# Patient Record
Sex: Male | Born: 1990 | Race: Black or African American | Hispanic: No | Marital: Single
Health system: Midwestern US, Community
[De-identification: ages and names within clinical notes are randomized; demographics above are authoritative.]

---

## 2017-09-05 ENCOUNTER — Emergency Department (HOSPITAL_BASED_OUTPATIENT_CLINIC_OR_DEPARTMENT_OTHER)
Admission: EM | Admit: 2017-09-05 | Discharge: 2017-09-06 | Disposition: A | Discharge status: Home / Self Care | Payer: No Typology Code available for payment source | Attending: Emergency Medicine | Admitting: Emergency Medicine

## 2017-09-05 ENCOUNTER — Encounter (HOSPITAL_BASED_OUTPATIENT_CLINIC_OR_DEPARTMENT_OTHER): Payer: Self-pay | Admitting: Adult Health

## 2017-09-05 ENCOUNTER — Other Ambulatory Visit: Payer: Self-pay

## 2017-09-05 DIAGNOSIS — Y999 Unspecified external cause status: Secondary | ICD-10-CM | POA: Diagnosis not present

## 2017-09-05 DIAGNOSIS — S40021A Contusion of right upper arm, initial encounter: Secondary | ICD-10-CM | POA: Diagnosis not present

## 2017-09-05 DIAGNOSIS — Y9389 Activity, other specified: Secondary | ICD-10-CM | POA: Diagnosis not present

## 2017-09-05 DIAGNOSIS — S0081XA Abrasion of other part of head, initial encounter: Secondary | ICD-10-CM | POA: Diagnosis not present

## 2017-09-05 DIAGNOSIS — S50811A Abrasion of right forearm, initial encounter: Secondary | ICD-10-CM | POA: Insufficient documentation

## 2017-09-05 DIAGNOSIS — Y92410 Unspecified street and highway as the place of occurrence of the external cause: Secondary | ICD-10-CM | POA: Insufficient documentation

## 2017-09-05 DIAGNOSIS — F17200 Nicotine dependence, unspecified, uncomplicated: Secondary | ICD-10-CM | POA: Diagnosis not present

## 2017-09-05 DIAGNOSIS — S40211A Abrasion of right shoulder, initial encounter: Secondary | ICD-10-CM | POA: Diagnosis not present

## 2017-09-05 DIAGNOSIS — S0993XA Unspecified injury of face, initial encounter: Secondary | ICD-10-CM | POA: Diagnosis present

## 2017-09-05 DIAGNOSIS — S161XXA Strain of muscle, fascia and tendon at neck level, initial encounter: Secondary | ICD-10-CM | POA: Insufficient documentation
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
## 2017-09-06 ENCOUNTER — Emergency Department (HOSPITAL_BASED_OUTPATIENT_CLINIC_OR_DEPARTMENT_OTHER): Payer: No Typology Code available for payment source

## 2017-09-06 MED ORDER — HYDROCODONE-ACETAMINOPHEN 5-325 MG PO TABS
2.0000 | ORAL_TABLET | Freq: Once | ORAL | Status: AC
  Administered 2017-09-06: 2 via ORAL
  Filled 2017-09-06: qty 2

## 2017-09-06 MED ORDER — HYDROCODONE-ACETAMINOPHEN 5-325 MG PO TABS: 1.0000 | ORAL_TABLET | Freq: Four times a day (QID) | ORAL | 0 refills | Status: DC | PRN

## 2017-09-06 MED ORDER — LIDOCAINE HCL URETHRAL/MUCOSAL 2 % EX GEL
1.0000 "application " | Freq: Once | CUTANEOUS | Status: AC
  Administered 2017-09-06: 1 via TOPICAL
  Filled 2017-09-06: qty 20

## 2017-09-06 NOTE — Discharge Instructions (Addendum)
Local wound care with bacitracin and dressing changes twice daily.  Hydrocodone as prescribed as needed for pain.  Follow-up as needed if symptoms worsen or change.
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
## 2022-05-09 ENCOUNTER — Emergency Department (HOSPITAL_COMMUNITY): Payer: Medicaid Other

## 2022-05-09 ENCOUNTER — Ambulatory Visit: Payer: Self-pay | Admitting: Otolaryngology

## 2022-05-09 ENCOUNTER — Encounter (HOSPITAL_COMMUNITY): Payer: Self-pay

## 2022-05-09 ENCOUNTER — Other Ambulatory Visit: Payer: Self-pay

## 2022-05-09 ENCOUNTER — Emergency Department (HOSPITAL_COMMUNITY)
Admission: EM | Admit: 2022-05-09 | Discharge: 2022-05-09 | Disposition: A | Discharge status: Home / Self Care | Payer: Medicaid Other | Attending: Emergency Medicine | Admitting: Emergency Medicine

## 2022-05-09 DIAGNOSIS — S02652A Fracture of angle of left mandible, initial encounter for closed fracture: Secondary | ICD-10-CM

## 2022-05-09 DIAGNOSIS — Z1152 Encounter for screening for COVID-19: Secondary | ICD-10-CM | POA: Insufficient documentation

## 2022-05-09 DIAGNOSIS — S0993XA Unspecified injury of face, initial encounter: Secondary | ICD-10-CM | POA: Diagnosis present

## 2022-05-09 LAB — RESP PANEL BY RT-PCR (RSV, FLU A&B, COVID)  RVPGX2
Influenza A by PCR: NEGATIVE
Influenza B by PCR: NEGATIVE
Resp Syncytial Virus by PCR: NEGATIVE
SARS Coronavirus 2 by RT PCR: NEGATIVE

## 2022-05-09 LAB — GROUP A STREP BY PCR: Group A Strep by PCR: NOT DETECTED

## 2022-05-09 MED ORDER — HYDROCODONE-ACETAMINOPHEN 7.5-325 MG/15ML PO SOLN
10.0000 mL | Freq: Once | ORAL | Status: AC
  Administered 2022-05-09: 10 mL via ORAL
  Filled 2022-05-09: qty 15

## 2022-05-09 MED ORDER — HYDROCODONE-ACETAMINOPHEN 7.5-325 MG/15ML PO SOLN: 15.0000 mL | Freq: Four times a day (QID) | ORAL | 0 refills | Status: DC | PRN

## 2022-05-09 MED ORDER — HYDROMORPHONE HCL 1 MG/ML IJ SOLN
0.5000 mg | Freq: Once | INTRAMUSCULAR | Status: AC
  Administered 2022-05-09: 0.5 mg via INTRAVENOUS
  Filled 2022-05-09: qty 1

## 2022-05-09 MED ORDER — HYDROCODONE-ACETAMINOPHEN 7.5-325 MG/15ML PO SOLN: 15.0000 mL | Freq: Four times a day (QID) | ORAL | 0 refills | Status: AC | PRN

## 2022-05-09 MED ORDER — KETOROLAC TROMETHAMINE 15 MG/ML IJ SOLN
15.0000 mg | Freq: Once | INTRAMUSCULAR | Status: AC
  Administered 2022-05-09: 15 mg via INTRAVENOUS
  Filled 2022-05-09: qty 1

## 2022-05-09 MED ORDER — ONDANSETRON HCL 4 MG/2ML IJ SOLN
4.0000 mg | Freq: Once | INTRAMUSCULAR | Status: AC
  Administered 2022-05-09: 4 mg via INTRAVENOUS
  Filled 2022-05-09: qty 2
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
## 2022-05-09 NOTE — Discharge Instructions (Addendum)
Go to short stay at [facility] at noon tomorrow.  Go to the main entrance.  Do not eat after midnight.

## 2022-05-10 ENCOUNTER — Encounter (HOSPITAL_COMMUNITY): Payer: Self-pay | Admitting: Otolaryngology

## 2022-05-10 ENCOUNTER — Inpatient Hospital Stay: Admit: 2022-05-10 | Payer: No Typology Code available for payment source | Admitting: Otolaryngology

## 2022-05-10 ENCOUNTER — Encounter (HOSPITAL_COMMUNITY): Payer: Self-pay | Admitting: Certified Registered Nurse Anesthetist

## 2022-05-10 SURGERY — OPEN REDUCTION INTERNAL FIXATION (ORIF) MANDIBULAR FRACTURE
Anesthesia: General

## 2022-05-12 ENCOUNTER — Other Ambulatory Visit: Payer: Self-pay

## 2022-05-12 ENCOUNTER — Emergency Department (HOSPITAL_COMMUNITY)
Admission: EM | Admit: 2022-05-12 | Discharge: 2022-05-12 | Discharge status: Against Medical Advice | Payer: Medicaid Other | Attending: Emergency Medicine | Admitting: Emergency Medicine

## 2022-05-12 DIAGNOSIS — R509 Fever, unspecified: Secondary | ICD-10-CM | POA: Diagnosis not present

## 2022-05-12 DIAGNOSIS — Z5329 Procedure and treatment not carried out because of patient's decision for other reasons: Secondary | ICD-10-CM | POA: Insufficient documentation

## 2022-05-12 DIAGNOSIS — S02652G Fracture of angle of left mandible, subsequent encounter for fracture with delayed healing: Secondary | ICD-10-CM

## 2022-05-12 DIAGNOSIS — S0993XA Unspecified injury of face, initial encounter: Secondary | ICD-10-CM | POA: Diagnosis present

## 2022-05-12 MED ORDER — MORPHINE SULFATE (PF) 4 MG/ML IV SOLN
4.0000 mg | Freq: Once | INTRAVENOUS | Status: AC
  Administered 2022-05-12: 4 mg via INTRAMUSCULAR
  Filled 2022-05-12: qty 1
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
## 2022-05-12 NOTE — ED Notes (Signed)
MD aware of temp 101. Per MD, pt is refusing treatment for fever at this time.

## 2022-05-12 NOTE — ED Notes (Signed)
Dr. Francia Greaves at bedside assessing pt at this time.
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
## 2022-05-12 NOTE — ED Notes (Signed)
Pt not in room. Staff did not see patient exit. Assume pt left. MD notified.

## 2022-05-12 NOTE — ED Notes (Signed)
Per MD, pt may have food and drink. Pt provided with 2 cups of water per request.
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
## 2022-05-12 NOTE — ED Notes (Signed)
Formatting of this note might be different from the original.  MD aware of temp 101. Per MD, pt is refusing treatment for fever at this time.  Electronically signed by Nickerson, [place] P, RN at 05/12/2022 10:01 AM EST

## 2022-05-12 NOTE — ED Notes (Signed)
Formatting of this note might be different from the original.  Per MD, pt may have food and drink. Pt provided with 2 cups of water per request.  Electronically signed by Nickerson, [place] P, RN at 05/12/2022  9:56 AM EST

## 2022-05-12 NOTE — ED Notes (Signed)
Formatting of this note might be different from the original.  Dr. Francia Greaves at bedside assessing pt at this time.   Electronically signed by Nickerson, [place] P, RN at 05/12/2022  8:52 AM EST
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
## 2022-05-14 ENCOUNTER — Encounter

## 2022-05-14 ENCOUNTER — Emergency Department: Admit: 2022-05-14 | Payer: PRIVATE HEALTH INSURANCE

## 2022-05-14 ENCOUNTER — Inpatient Hospital Stay
Admit: 2022-05-14 | Discharge: 2022-05-14 | Disposition: A | Discharge status: Home / Self Care | Payer: PRIVATE HEALTH INSURANCE | Attending: Emergency Medicine

## 2022-05-14 DIAGNOSIS — S02652A Fracture of angle of left mandible, initial encounter for closed fracture: Secondary | ICD-10-CM

## 2022-05-14 LAB — CBC WITH AUTO DIFFERENTIAL
Absolute Immature Granulocyte: 0 10*3/uL (ref 0.00–0.04)
Basophils %: 0 % (ref 0–1)
Basophils Absolute: 0 10*3/uL (ref 0.0–0.1)
Eosinophils %: 0 % (ref 0–7)
Eosinophils Absolute: 0 10*3/uL (ref 0.0–0.4)
Hematocrit: 43.6 % (ref 36.6–50.3)
Hemoglobin: 15.2 g/dL (ref 12.1–17.0)
Immature Granulocytes: 0 % (ref 0–0.5)
Lymphocytes %: 7 % — ABNORMAL LOW (ref 12–49)
Lymphocytes Absolute: 0.8 10*3/uL (ref 0.8–3.5)
MCH: 29.6 PG (ref 26.0–34.0)
MCHC: 34.9 g/dL (ref 30.0–36.5)
MCV: 84.8 FL (ref 80.0–99.0)
MPV: 8.8 FL — ABNORMAL LOW (ref 8.9–12.9)
Monocytes %: 16 % — ABNORMAL HIGH (ref 5–13)
Monocytes Absolute: 1.8 10*3/uL — ABNORMAL HIGH (ref 0.0–1.0)
Neutrophils %: 77 % — ABNORMAL HIGH (ref 32–75)
Neutrophils Absolute: 8.4 10*3/uL — ABNORMAL HIGH (ref 1.8–8.0)
Nucleated RBCs: 0 PER 100 WBC
Platelets: 412 10*3/uL — ABNORMAL HIGH (ref 150–400)
RBC: 5.14 M/uL (ref 4.10–5.70)
RDW: 12.5 % (ref 11.5–14.5)
WBC: 11 10*3/uL (ref 4.1–11.1)
nRBC: 0 10*3/uL (ref 0.00–0.01)

## 2022-05-14 LAB — COMPREHENSIVE METABOLIC PANEL
ALT: 116 U/L — ABNORMAL HIGH (ref 12–78)
AST: 56 U/L — ABNORMAL HIGH (ref 15–37)
Albumin/Globulin Ratio: 0.7 — ABNORMAL LOW (ref 1.1–2.2)
Albumin: 3.6 g/dL (ref 3.5–5.0)
Alk Phosphatase: 141 U/L — ABNORMAL HIGH (ref 45–117)
Anion Gap: 18 mmol/L — ABNORMAL HIGH (ref 5–15)
BUN: 15 MG/DL (ref 6–20)
Bun/Cre Ratio: 14 (ref 12–20)
CO2: 24 mmol/L (ref 21–32)
Calcium: 9.9 MG/DL (ref 8.5–10.1)
Chloride: 99 mmol/L (ref 97–108)
Creatinine: 1.11 MG/DL (ref 0.70–1.30)
Est, Glom Filt Rate: >60 mL/min/{1.73_m2} (ref >60)
Globulin: 5.2 g/dL — ABNORMAL HIGH (ref 2.0–4.0)
Glucose: 85 mg/dL (ref 65–100)
Potassium: 3.3 mmol/L — ABNORMAL LOW (ref 3.5–5.1)
Sodium: 141 mmol/L (ref 136–145)
Total Bilirubin: 0.8 MG/DL (ref 0.2–1.0)
Total Protein: 8.8 g/dL — ABNORMAL HIGH (ref 6.4–8.2)

## 2022-05-14 MED ORDER — HYDROCODONE-ACETAMINOPHEN 5-325 MG PO TABS
5-325 | ORAL | Status: DC
Start: 2022-05-14 — End: 2022-05-14

## 2022-05-14 MED ORDER — NAPROXEN 125 MG/5ML PO SUSP
125 | Freq: Three times a day (TID) | ORAL | 0 refills | Status: AC | PRN
Start: 2022-05-14 — End: 2022-05-24

## 2022-05-14 MED ORDER — NAPROXEN 500 MG PO TABS
500 | ORAL_TABLET | Freq: Two times a day (BID) | ORAL | 0 refills | Status: AC
Start: 2022-05-14 — End: 2022-05-28

## 2022-05-14 MED ORDER — KETOROLAC TROMETHAMINE 30 MG/ML IJ SOLN
30 | INTRAMUSCULAR | Status: AC
Start: 2022-05-14 — End: 2022-05-14
  Administered 2022-05-14: 19:00:00 30 mg via INTRAMUSCULAR

## 2022-05-14 MED FILL — HYDROCODONE-ACETAMINOPHEN 5-325 MG PO TABS: 5-325 MG | ORAL | Qty: 1

## 2022-05-14 MED FILL — KETOROLAC TROMETHAMINE 30 MG/ML IJ SOLN: 30 MG/ML | INTRAMUSCULAR | Qty: 1
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
## 2022-05-14 NOTE — ED Notes (Signed)
Patient medicated, tolerated well.
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
